# Patient Record
Sex: Male | Born: 1965 | Race: Black or African American | Hispanic: No | Marital: Single | Smoking: Current every day smoker
Health system: Southern US, Community
[De-identification: ages and names within clinical notes are randomized; demographics above are authoritative.]

## PROBLEM LIST (undated history)

## (undated) DIAGNOSIS — E119 Type 2 diabetes mellitus without complications: Secondary | ICD-10-CM

## (undated) DIAGNOSIS — I10 Essential (primary) hypertension: Secondary | ICD-10-CM

---

## 2002-10-26 ENCOUNTER — Encounter: Payer: Self-pay | Admitting: Family Medicine

## 2002-10-26 ENCOUNTER — Encounter: Admission: RE | Admit: 2002-10-26 | Discharge: 2002-10-26 | Payer: Self-pay | Admitting: Family Medicine

## 2007-03-25 ENCOUNTER — Emergency Department (HOSPITAL_COMMUNITY): Admission: EM | Admit: 2007-03-25 | Discharge: 2007-03-25 | Payer: Self-pay | Admitting: Emergency Medicine

## 2008-04-07 ENCOUNTER — Emergency Department (HOSPITAL_COMMUNITY): Admission: EM | Admit: 2008-04-07 | Discharge: 2008-04-07 | Payer: Self-pay | Admitting: Emergency Medicine

## 2008-04-12 IMAGING — CR DG CHEST 2V
2 series · 2 of 2 positions shown · non-contrast
Comparison: None

CLINICAL DATA: Chest pain.
 CHEST - 2 VIEW:

[w chest pa]
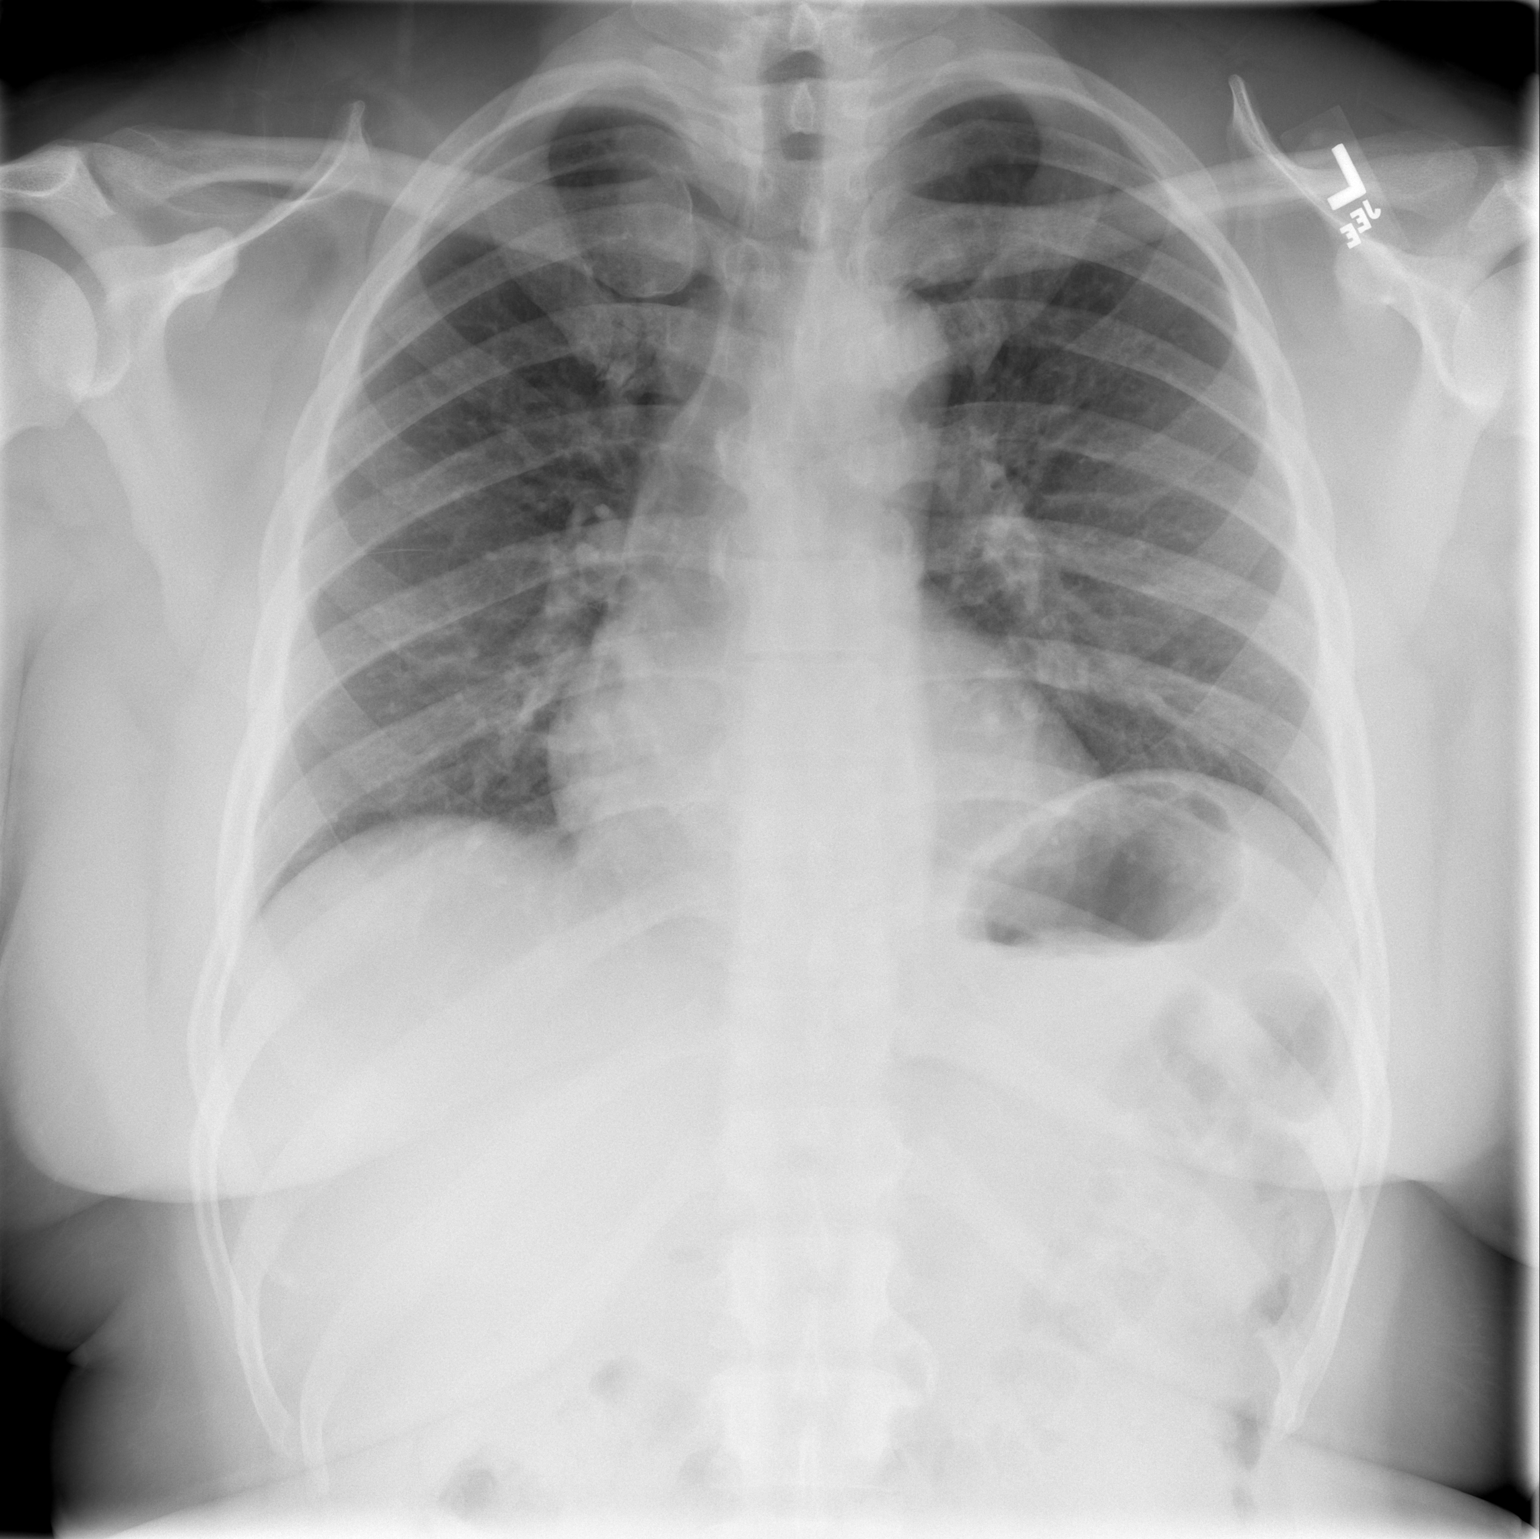

[w chest lat]
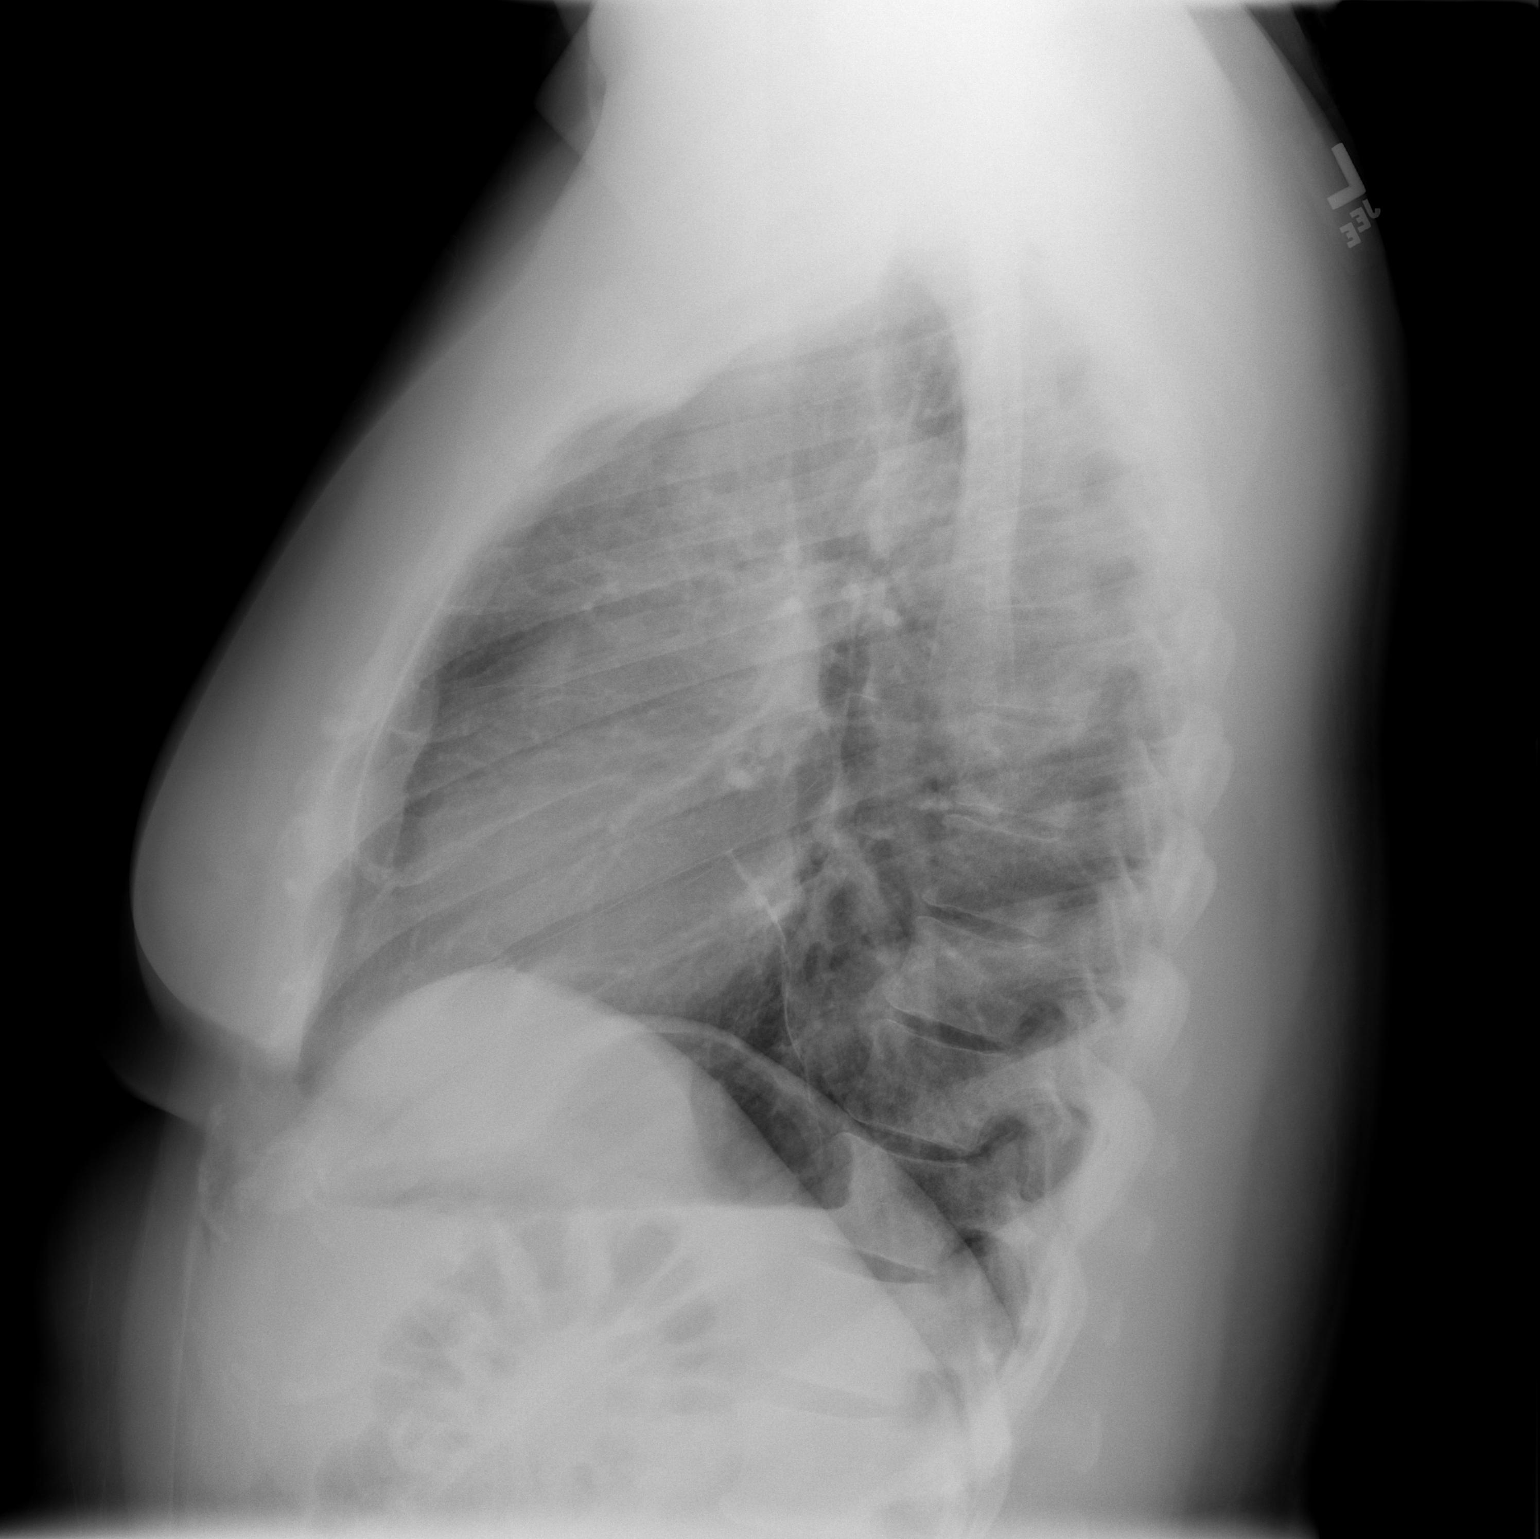

[2 of 2 positions shown; findings below may reference images not displayed]

FINDINGS: Heart size is normal.  There is no edema or infiltrate.  Small linear scar is noted in the base on the lateral view.  There is no effusion.
IMPRESSION: No acute abnormality.

## 2010-03-22 ENCOUNTER — Ambulatory Visit: Payer: Self-pay | Admitting: Internal Medicine

## 2010-04-27 ENCOUNTER — Encounter (INDEPENDENT_AMBULATORY_CARE_PROVIDER_SITE_OTHER): Payer: Self-pay | Admitting: Family Medicine

## 2010-04-27 LAB — CONVERTED CEMR LAB
ALT: 19 units/L (ref 0–53)
AST: 15 units/L (ref 0–37)
Albumin: 4.7 g/dL (ref 3.5–5.2)
Alkaline Phosphatase: 48 units/L (ref 39–117)
BUN: 13 mg/dL (ref 6–23)
CO2: 29 meq/L (ref 19–32)
Calcium: 9.7 mg/dL (ref 8.4–10.5)
Chloride: 106 meq/L (ref 96–112)
Cholesterol: 147 mg/dL (ref 0–200)
Creatinine, Ser: 1.12 mg/dL (ref 0.40–1.50)
Glucose, Bld: 95 mg/dL (ref 70–99)
HDL: 47 mg/dL (ref >39)
LDL Cholesterol: 64 mg/dL (ref 0–99)
Microalb, Ur: 0.59 mg/dL (ref 0.00–1.89)
Potassium: 3.9 meq/L (ref 3.5–5.3)
Sodium: 143 meq/L (ref 135–145)
Total Bilirubin: 0.5 mg/dL (ref 0.3–1.2)
Total CHOL/HDL Ratio: 3.1
Total Protein: 7.9 g/dL (ref 6.0–8.3)
Triglycerides: 181 mg/dL — ABNORMAL HIGH (ref <150)
VLDL: 36 mg/dL (ref 0–40)

## 2011-05-28 LAB — I-STAT 8, (EC8 V) (CONVERTED LAB)
Acid-Base Excess: 3 — ABNORMAL HIGH
BUN: 10
Bicarbonate: 27.3 — ABNORMAL HIGH
Chloride: 107
Glucose, Bld: 137 — ABNORMAL HIGH
HCT: 46
Hemoglobin: 15.6
Operator id: 277751
Potassium: 5.3 — ABNORMAL HIGH
Sodium: 138
TCO2: 28
pCO2, Ven: 38.7 — ABNORMAL LOW
pH, Ven: 7.456 — ABNORMAL HIGH

## 2011-05-28 LAB — POCT CARDIAC MARKERS
CKMB, poc: <1 — ABNORMAL LOW
Myoglobin, poc: 65
Operator id: 277751
Troponin i, poc: <0.05

## 2011-05-28 LAB — POCT I-STAT CREATININE
Creatinine, Ser: 1.2
Operator id: 277751

## 2011-05-28 LAB — CBC
HCT: 41.7
Hemoglobin: 14.3
MCHC: 34.2
MCV: 86.1
Platelets: 181
RBC: 4.84
RDW: 13.8
WBC: 8.4

## 2011-05-28 LAB — DIFFERENTIAL
Basophils Absolute: 0
Basophils Relative: 1
Eosinophils Absolute: 0.1
Eosinophils Relative: 1
Lymphocytes Relative: 27
Lymphs Abs: 2.3
Monocytes Absolute: 0.4
Monocytes Relative: 4
Neutro Abs: 5.7
Neutrophils Relative %: 67

## 2013-01-19 ENCOUNTER — Emergency Department (HOSPITAL_COMMUNITY)
Admission: EM | Admit: 2013-01-19 | Discharge: 2013-01-19 | Disposition: A | Discharge status: Home / Self Care | Payer: 59 | Source: Home / Self Care | Attending: Emergency Medicine | Admitting: Emergency Medicine

## 2013-01-19 ENCOUNTER — Encounter (HOSPITAL_COMMUNITY): Payer: Self-pay | Admitting: *Deleted

## 2013-01-19 DIAGNOSIS — M549 Dorsalgia, unspecified: Secondary | ICD-10-CM

## 2013-01-19 HISTORY — DX: Essential (primary) hypertension: I10

## 2013-01-19 HISTORY — DX: Type 2 diabetes mellitus without complications: E11.9

## 2013-01-19 LAB — POCT URINALYSIS DIP (DEVICE)
Bilirubin Urine: NEGATIVE
Glucose, UA: NEGATIVE mg/dL
Ketones, ur: NEGATIVE mg/dL
Leukocytes, UA: NEGATIVE

## 2013-01-19 MED ORDER — TRAMADOL HCL 50 MG PO TABS
50.0000 mg | ORAL_TABLET | Freq: Four times a day (QID) | ORAL | Status: AC | PRN
Start: 2013-01-19 — End: ?

## 2013-01-19 MED ORDER — CYCLOBENZAPRINE HCL 10 MG PO TABS: 10.0000 mg | ORAL_TABLET | Freq: Two times a day (BID) | ORAL | Status: AC | PRN

## 2013-01-19 NOTE — ED Notes (Signed)
Pt  Reports  Pain l  Side    With  Pain  Increased   With  Movement  And  Or  Deep  Breath  -    Pt  Reports  Symptoms  X  2  Days  -  Pt   Is  Sitting upright on  Exam table  Speaking in  Complete  sentances  And  Is  Awake  And  Alert      - pt  States  He  Has  A  History of Htn  /  Dm

## 2013-01-19 NOTE — ED Provider Notes (Signed)
History     CSN: [id number]  Arrival date & time 01/19/13  1702   First MD Initiated Contact with Patient 01/19/13 1801      Chief Complaint  Patient presents with  . Flank Pain    (Consider location/radiation/quality/duration/timing/severity/associated sxs/prior treatment) HPI Comments: Patient presents urgent care this evening describing the for the last 2 days he's been having pain on his left lower side sent back. He describes that he was mowing on Saturday when this first started. Patient denies any falls or injuries and denies any fevers, urinary symptoms, nausea vomiting or abdominal pain.  Patient is a 47 y.o. male presenting with flank pain. The history is provided by the patient.  Flank Pain This is a new problem. The current episode started 2 days ago. The problem occurs constantly. The problem has not changed since onset.Pertinent negatives include no chest pain, no abdominal pain, no headaches and no shortness of breath. The symptoms are aggravated by twisting. The symptoms are relieved by rest. He has tried nothing for the symptoms. The treatment provided no relief.    Past Medical History  Diagnosis Date  . Hypertension   . Diabetes mellitus without complication     History reviewed. No pertinent past surgical history.  History reviewed. No pertinent family history.  History  Substance Use Topics  . Smoking status: Never Smoker   . Smokeless tobacco: Not on file  . Alcohol Use: Yes      Review of Systems  Respiratory: Negative for shortness of breath.   Cardiovascular: Negative for chest pain.  Gastrointestinal: Negative for abdominal pain.  Genitourinary: Positive for flank pain.  Neurological: Negative for headaches.    Allergies  Review of patient's allergies indicates no known allergies.  Home Medications   Current Outpatient Rx  Name  Route  Sig  Dispense  Refill  . cyclobenzaprine (FLEXERIL) 10 MG tablet   Oral   Take 1 tablet (10 mg  total) by mouth 2 (two) times daily as needed for muscle spasms.   20 tablet   0   . traMADol (ULTRAM) 50 MG tablet   Oral   Take 1 tablet (50 mg total) by mouth every 6 (six) hours as needed for pain.   15 tablet   0     BP 128/78  Pulse 70  Temp(Src) 98 F (36.7 C) (Oral)  Resp 16  SpO2 100%  Physical Exam  ED Course  Procedures (including critical care time)  Labs Reviewed  POCT URINALYSIS DIP (DEVICE)   No results found.   1. Back pain, acute       MDM  Patient- looks comfortable afebrile, no hematuria. Exam most consistent with a muscular type pain, predominanatly lower lateral abdominal wall. Discussed with patient symptoms that should require of further evaluation. He agreed. Rx Flexeril Rx Tramadol        Jimmie Molly, MD 01/20/13 [phone]

## 2013-01-19 NOTE — Discharge Instructions (Signed)
Your current exam was most consistent with a muscular sprain/strain of your back. Take this medicines as prescribed. We discussed what symptoms should warrant further evaluation.

## 2013-01-26 ENCOUNTER — Encounter (HOSPITAL_COMMUNITY): Payer: Self-pay | Admitting: Emergency Medicine

## 2013-01-26 ENCOUNTER — Emergency Department (HOSPITAL_COMMUNITY)
Admission: EM | Admit: 2013-01-26 | Discharge: 2013-01-26 | Disposition: A | Discharge status: Home / Self Care | Payer: 59 | Source: Home / Self Care | Attending: Emergency Medicine | Admitting: Emergency Medicine

## 2013-01-26 DIAGNOSIS — T148XXA Other injury of unspecified body region, initial encounter: Secondary | ICD-10-CM

## 2013-01-26 DIAGNOSIS — Z23 Encounter for immunization: Secondary | ICD-10-CM

## 2013-01-26 DIAGNOSIS — IMO0002 Reserved for concepts with insufficient information to code with codable children: Secondary | ICD-10-CM

## 2013-01-26 MED ORDER — TETANUS-DIPHTH-ACELL PERTUSSIS 5-2.5-18.5 LF-MCG/0.5 IM SUSP
0.5000 mL | Freq: Once | INTRAMUSCULAR | Status: AC
  Administered 2013-01-26: 0.5 mL via INTRAMUSCULAR

## 2013-01-26 MED ORDER — TETANUS-DIPHTH-ACELL PERTUSSIS 5-2.5-18.5 LF-MCG/0.5 IM SUSP
INTRAMUSCULAR | Status: AC
  Filled 2013-01-26: qty 0.5

## 2013-01-26 NOTE — ED Notes (Signed)
Laceration to left hand, plam at base of thumb.  Bleeding controlled.  Reports this occurred today while trying to remove air conditioner from under a house.   Unsure of last tetanus

## 2013-01-26 NOTE — ED Provider Notes (Signed)
Chief Complaint:   Chief Complaint  Patient presents with  . Laceration    History of Present Illness:   Jimmy Spears is a 47 year old male who lacerated the web space of his left thumb and index finger around 11 today on an air conditioner. The bleeding is controlled. He has full range of motion of all the digits and denies any numbness or tingling. He cannot recall his last tetanus vaccine.  Review of Systems:  Other than noted above, the patient denies any of the following symptoms: Systemic:  No fever or chills. Musculoskeletal:  No joint pain or decreased range of motion. Neuro:  No numbness, tingling, or weakness.  PMFSH:  Past medical history, family history, social history, meds, and allergies were reviewed. He has diet-controlled diabetes.  Physical Exam:   Vital signs:  BP 133/94  Pulse 101  Temp(Src) 98.2 F (36.8 C) (Oral)  Resp 16  SpO2 100% Ext:  There is a 2 cm laceration in the webspace between the thumb and index finger. This is shallow and bleeding was controlled. He is able to move all his digits well. Sensation was intact. Flexor and extensor mechanisms were intact of the thumb and index finger.  All other joints had a full ROM without pain.  Pulses were full.  Good capillary refill in all digits.  No edema. Neurological:  Alert and oriented.  No muscle weakness.  Sensation was intact to light touch.   Procedure: Verbal informed consent was obtained.  The patient was informed of the risks and benefits of the procedure and understands and accepts.  Identity of the patient was verified verbally and by wristband.   The laceration area described above was prepped with Betadine and saline  and anesthetized with 10 mL of 2% Xylocaine without epinephrine.  The wound was then closed as follows:  Wound edges were approximated with 8 5-0 nylon sutures.  There were no immediate complications, and the patient tolerated the procedure well. The laceration was then cleansed,  Bacitracin ointment was applied and a clean, dry pressure dressing was put on.   Course in Urgent Care Center:   Given a Tdap vaccine.  Assessment:  The encounter diagnosis was Laceration.  Plan:   1.  The following meds were prescribed:   Discharge Medication List as of 01/26/2013  2:11 PM     2.  The patient was instructed in wound care and pain control, and handouts were given. 3.  The patient was told to return in 14 days for suture removal or wound recheck or sooner if any sign of infection.     Reuben Likes, MD 01/26/13 [phone]

## 2019-11-14 ENCOUNTER — Other Ambulatory Visit: Payer: Self-pay

## 2019-11-14 ENCOUNTER — Emergency Department (HOSPITAL_COMMUNITY): Payer: Self-pay

## 2019-11-14 ENCOUNTER — Encounter (HOSPITAL_COMMUNITY): Payer: Self-pay | Admitting: Emergency Medicine

## 2019-11-14 ENCOUNTER — Emergency Department (HOSPITAL_COMMUNITY)
Admission: EM | Admit: 2019-11-14 | Discharge: 2019-11-14 | Disposition: A | Discharge status: Home / Self Care | Payer: Self-pay | Attending: Emergency Medicine | Admitting: Emergency Medicine

## 2019-11-14 DIAGNOSIS — R002 Palpitations: Secondary | ICD-10-CM | POA: Insufficient documentation

## 2019-11-14 DIAGNOSIS — E119 Type 2 diabetes mellitus without complications: Secondary | ICD-10-CM | POA: Insufficient documentation

## 2019-11-14 DIAGNOSIS — I10 Essential (primary) hypertension: Secondary | ICD-10-CM | POA: Insufficient documentation

## 2019-11-14 DIAGNOSIS — F17290 Nicotine dependence, other tobacco product, uncomplicated: Secondary | ICD-10-CM | POA: Insufficient documentation

## 2019-11-14 LAB — BASIC METABOLIC PANEL
Anion gap: 14 (ref 5–15)
BUN: 12 mg/dL (ref 6–20)
CO2: 26 mmol/L (ref 22–32)
Calcium: 9.4 mg/dL (ref 8.9–10.3)
Chloride: 97 mmol/L — ABNORMAL LOW (ref 98–111)
Creatinine, Ser: 1.03 mg/dL (ref 0.61–1.24)
GFR calc Af Amer: >60 mL/min (ref >60)
GFR calc non Af Amer: >60 mL/min (ref >60)
Glucose, Bld: 382 mg/dL — ABNORMAL HIGH (ref 70–99)
Potassium: 4 mmol/L (ref 3.5–5.1)
Sodium: 137 mmol/L (ref 135–145)

## 2019-11-14 LAB — CBC
HCT: 50 % (ref 39.0–52.0)
Hemoglobin: 16 g/dL (ref 13.0–17.0)
MCH: 29.2 pg (ref 26.0–34.0)
MCHC: 32 g/dL (ref 30.0–36.0)
MCV: 91.2 fL (ref 80.0–100.0)
Platelets: 149 10*3/uL — ABNORMAL LOW (ref 150–400)
RBC: 5.48 MIL/uL (ref 4.22–5.81)
RDW: 13.3 % (ref 11.5–15.5)
WBC: 8.9 10*3/uL (ref 4.0–10.5)
nRBC: 0 % (ref 0.0–0.2)

## 2019-11-14 LAB — TROPONIN I (HIGH SENSITIVITY): Troponin I (High Sensitivity): 4 ng/L (ref <18)

## 2019-11-14 MED ORDER — SODIUM CHLORIDE 0.9% FLUSH: 3.0000 mL | Freq: Once | INTRAVENOUS | Status: DC

## 2019-11-14 NOTE — ED Notes (Signed)
Patient Alert and oriented to baseline. Stable and ambulatory to baseline. Patient verbalized understanding of the discharge instructions.  Patient belongings were taken by the patient.   

## 2019-11-14 NOTE — ED Triage Notes (Signed)
Pt to ED with c/o heart palpitations.  Pt st's onset while carrying a car battery.  Pt denies any hx of same.  St's he was drinking energy drinks yesterday.

## 2019-11-14 NOTE — ED Provider Notes (Signed)
[place] [facility] EMERGENCY DEPARTMENT Provider Note   CSN: [id number] Arrival date & time: 11/14/19  1617     History Chief Complaint  Patient presents with  . Irregular Heart Beat    Audley D Mcmillon is a 54 y.o. male.  HPI   54 year old male with palpitations.  Onset little bit before arrival.  He was carrying a let acid battery into [employer] when symptoms began.  He just had a heated discussion just before this as well.  He felt like his heart was racing/fluttering.  Lasted approximately 15 minutes that is static in the emergency room.  Has since resolved with no recurrence.  He denies having any pain.  No dyspnea.  Over the past several days he has been more or less in his usual state of health.  He did have two energy drinks yesterday but none today.  Past Medical History:  Diagnosis Date  . Diabetes mellitus without complication ([place])   . Hypertension     There are no problems to display for this patient.   History reviewed. No pertinent surgical history.     No family history on file.  Social History   Tobacco Use  . Smoking status: Current Every Day Smoker    Types: Cigars  . Smokeless tobacco: Never Used  Substance Use Topics  . Alcohol use: Yes  . Drug use: No    Home Medications Prior to Admission medications   Medication Sig Start Date End Date Taking? Authorizing Provider  cyclobenzaprine (FLEXERIL) 10 MG tablet Take 1 tablet (10 mg total) by mouth 2 (two) times daily as needed for muscle spasms. 01/19/13   Rosana Hoes, MD  traMADol (ULTRAM) 50 MG tablet Take 1 tablet (50 mg total) by mouth every 6 (six) hours as needed for pain. 01/19/13   Rosana Hoes, MD    Allergies    Patient has no known allergies.  Review of Systems   Review of Systems  All systems reviewed and negative, other than as noted in HPI.  Physical Exam Updated Vital Signs BP (!) 163/100   Pulse (!) 101   Temp 98.3 F (36.8 C) (Oral)   Resp (!) 22   Ht 5\' 11"   (1.803 m)   Wt 136.1 kg   SpO2 99%   BMI 41.84 kg/m   Physical Exam Vitals and nursing note reviewed.  Constitutional:      General: He is not in acute distress.    Appearance: He is well-developed.  HENT:     Head: Normocephalic and atraumatic.  Eyes:     General:        Right eye: No discharge.        Left eye: No discharge.     Conjunctiva/sclera: Conjunctivae normal.  Cardiovascular:     Rate and Rhythm: Normal rate and regular rhythm.     Heart sounds: Normal heart sounds. No murmur. No friction rub. No gallop.   Pulmonary:     Effort: Pulmonary effort is normal. No respiratory distress.     Breath sounds: Normal breath sounds.  Abdominal:     General: There is no distension.     Palpations: Abdomen is soft.     Tenderness: There is no abdominal tenderness.  Musculoskeletal:        General: No tenderness.     Cervical back: Neck supple.  Skin:    General: Skin is warm and dry.  Neurological:     Mental Status: He is alert.  Psychiatric:  Behavior: Behavior normal.        Thought Content: Thought content normal.     ED Results / Procedures / Treatments   Labs (all labs ordered are listed, but only abnormal results are displayed) Labs Reviewed  BASIC METABOLIC PANEL - Abnormal; Notable for the following components:      Result Value   Chloride 97 (*)    Glucose, Bld 382 (*)    All other components within normal limits  CBC - Abnormal; Notable for the following components:   Platelets 149 (*)    All other components within normal limits  TROPONIN I (HIGH SENSITIVITY)    EKG EKG Interpretation  Date/Time:  Saturday November 14 2019 16:21:18 EDT Ventricular Rate:  107 PR Interval:  162 QRS Duration: 92 QT Interval:  344 QTC Calculation: 459 R Axis:   -27 Text Interpretation: Sinus tachycardia Possible Anterior infarct , age undetermined Abnormal ECG Confirmed by Raeford Razor [phone]) on 11/14/2019 4:45:01 PM   Radiology DG Chest 2 View  Result  Date: 11/14/2019 CLINICAL DATA:  54 year old male with irregular heartbeat and palpitations EXAM: CHEST - 2 VIEW COMPARISON:  Prior chest x-ray 03/25/2007 FINDINGS: The lungs are clear and negative for focal airspace consolidation, pulmonary edema or suspicious pulmonary nodule. No pleural effusion or pneumothorax. Cardiac and mediastinal contours are within normal limits. No acute fracture or lytic or blastic osseous lesions. The visualized upper abdominal bowel gas pattern is unremarkable. IMPRESSION: Negative chest x-ray. Electronically Signed   By: Malachy Moan M.D.   On: 11/14/2019 17:07    Procedures Procedures (including critical care time)  Medications Ordered in ED Medications  sodium chloride flush (NS) 0.9 % injection 3 mL (has no administration in time range)    ED Course  I have reviewed the triage vital signs and the nursing notes.  Pertinent labs & imaging results that were available during my care of the patient were reviewed by me and considered in my medical decision making (see chart for details).    MDM Rules/Calculators/A&P                      54 year old male with palpitations which has since resolved without recurrence.  Borderline resting sinus tachycardia.  Appears comfortable though.  ED work-up fairly reassuring.  Hypertension noted.  Patient advised to have this rechecked as an outpatient.  If symptoms persist then he may potentially benefit from a Holter monitor.  Needs to follow-up with his PCP to discuss further.  Emergent return precautions discussed.   Final Clinical Impression(s) / ED Diagnoses Final diagnoses:  Palpitations    Rx / DC Orders ED Discharge Orders    None       Raeford Razor, MD 11/16/19 [phone]

## 2020-12-02 IMAGING — CR DG CHEST 2V
2 series · 2 of 2 positions shown · non-contrast
Comparison: Prior chest x-ray 03/25/2007

CLINICAL DATA: 53-year-old male with irregular heartbeat and
palpitations

EXAM:
CHEST - 2 VIEW

[chest pa]
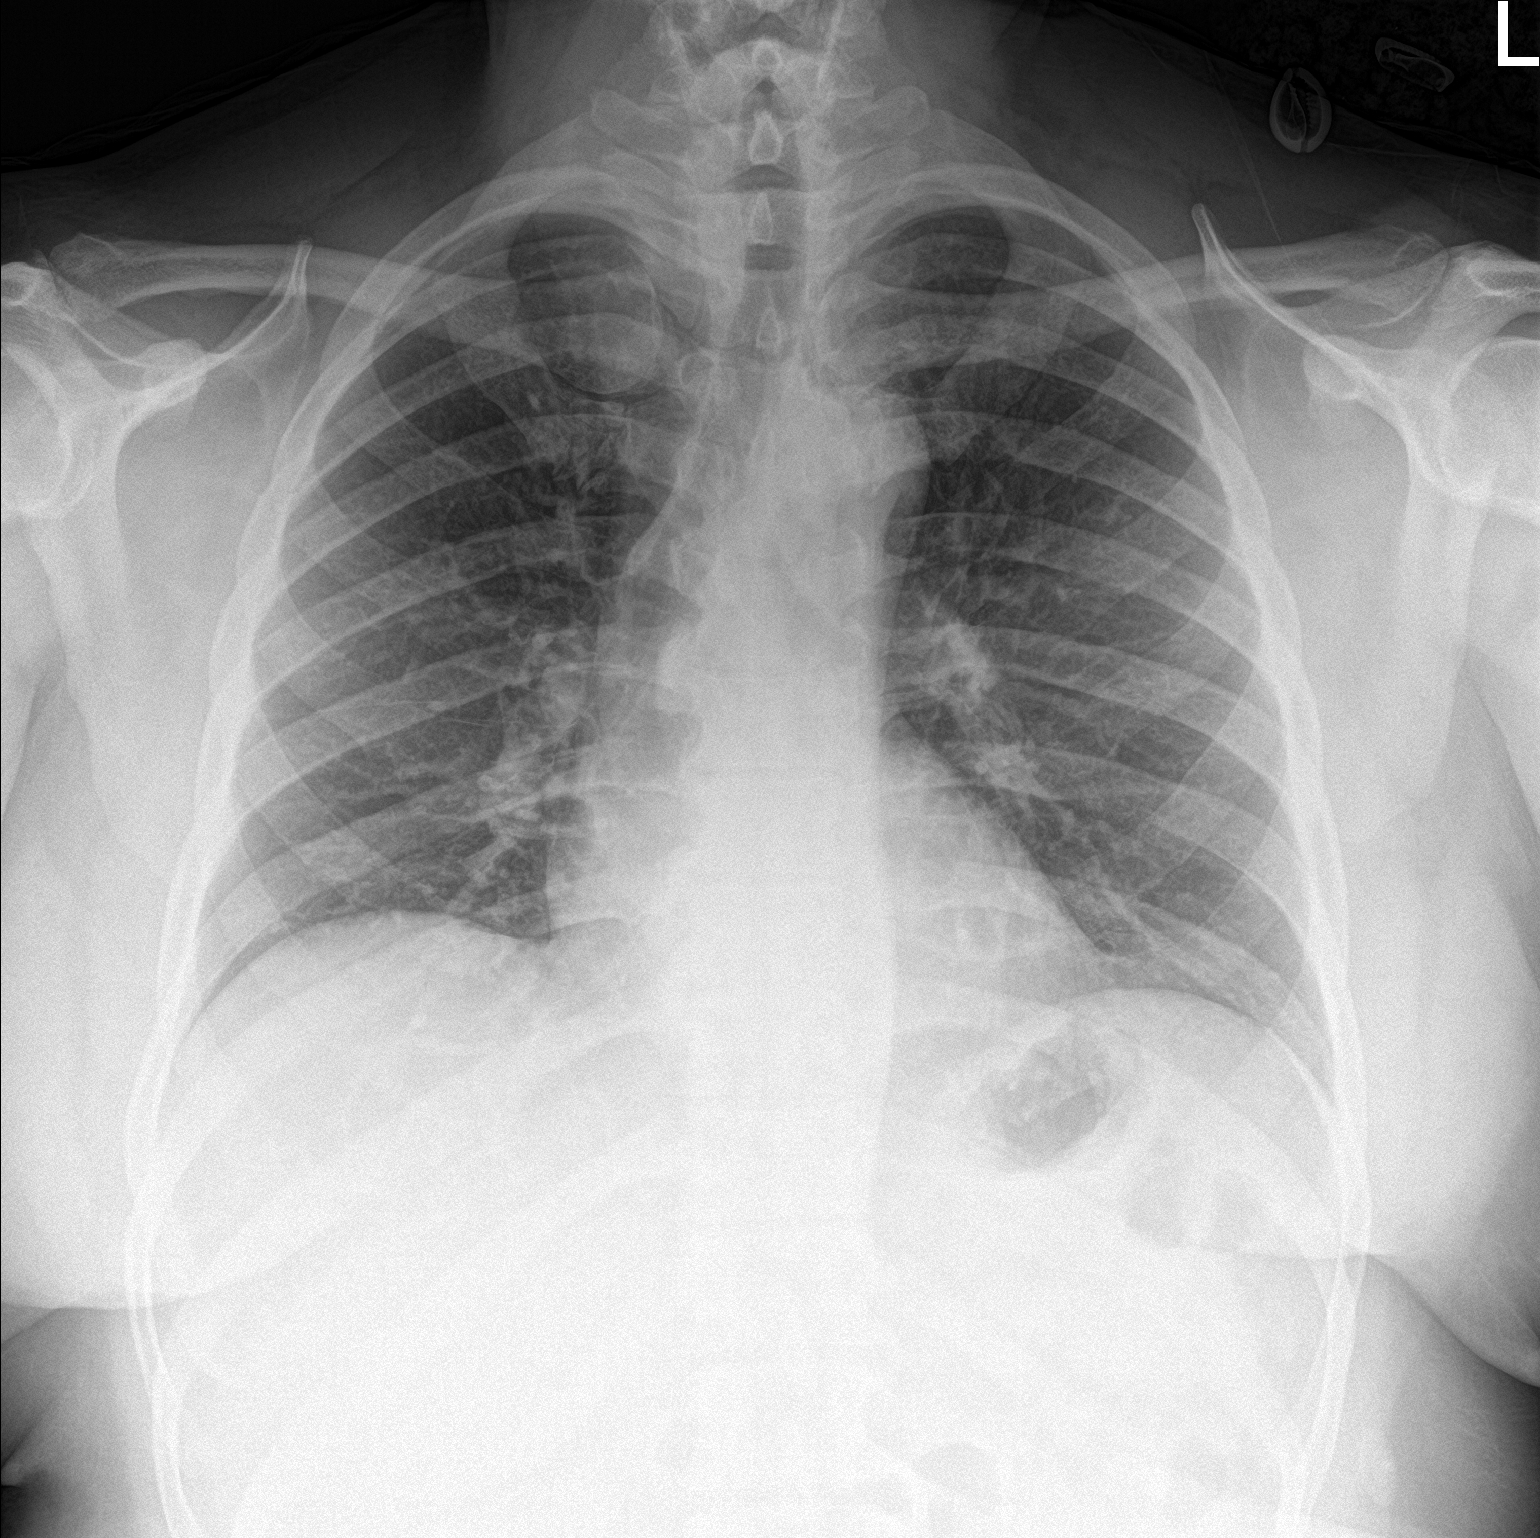

[chest lat]
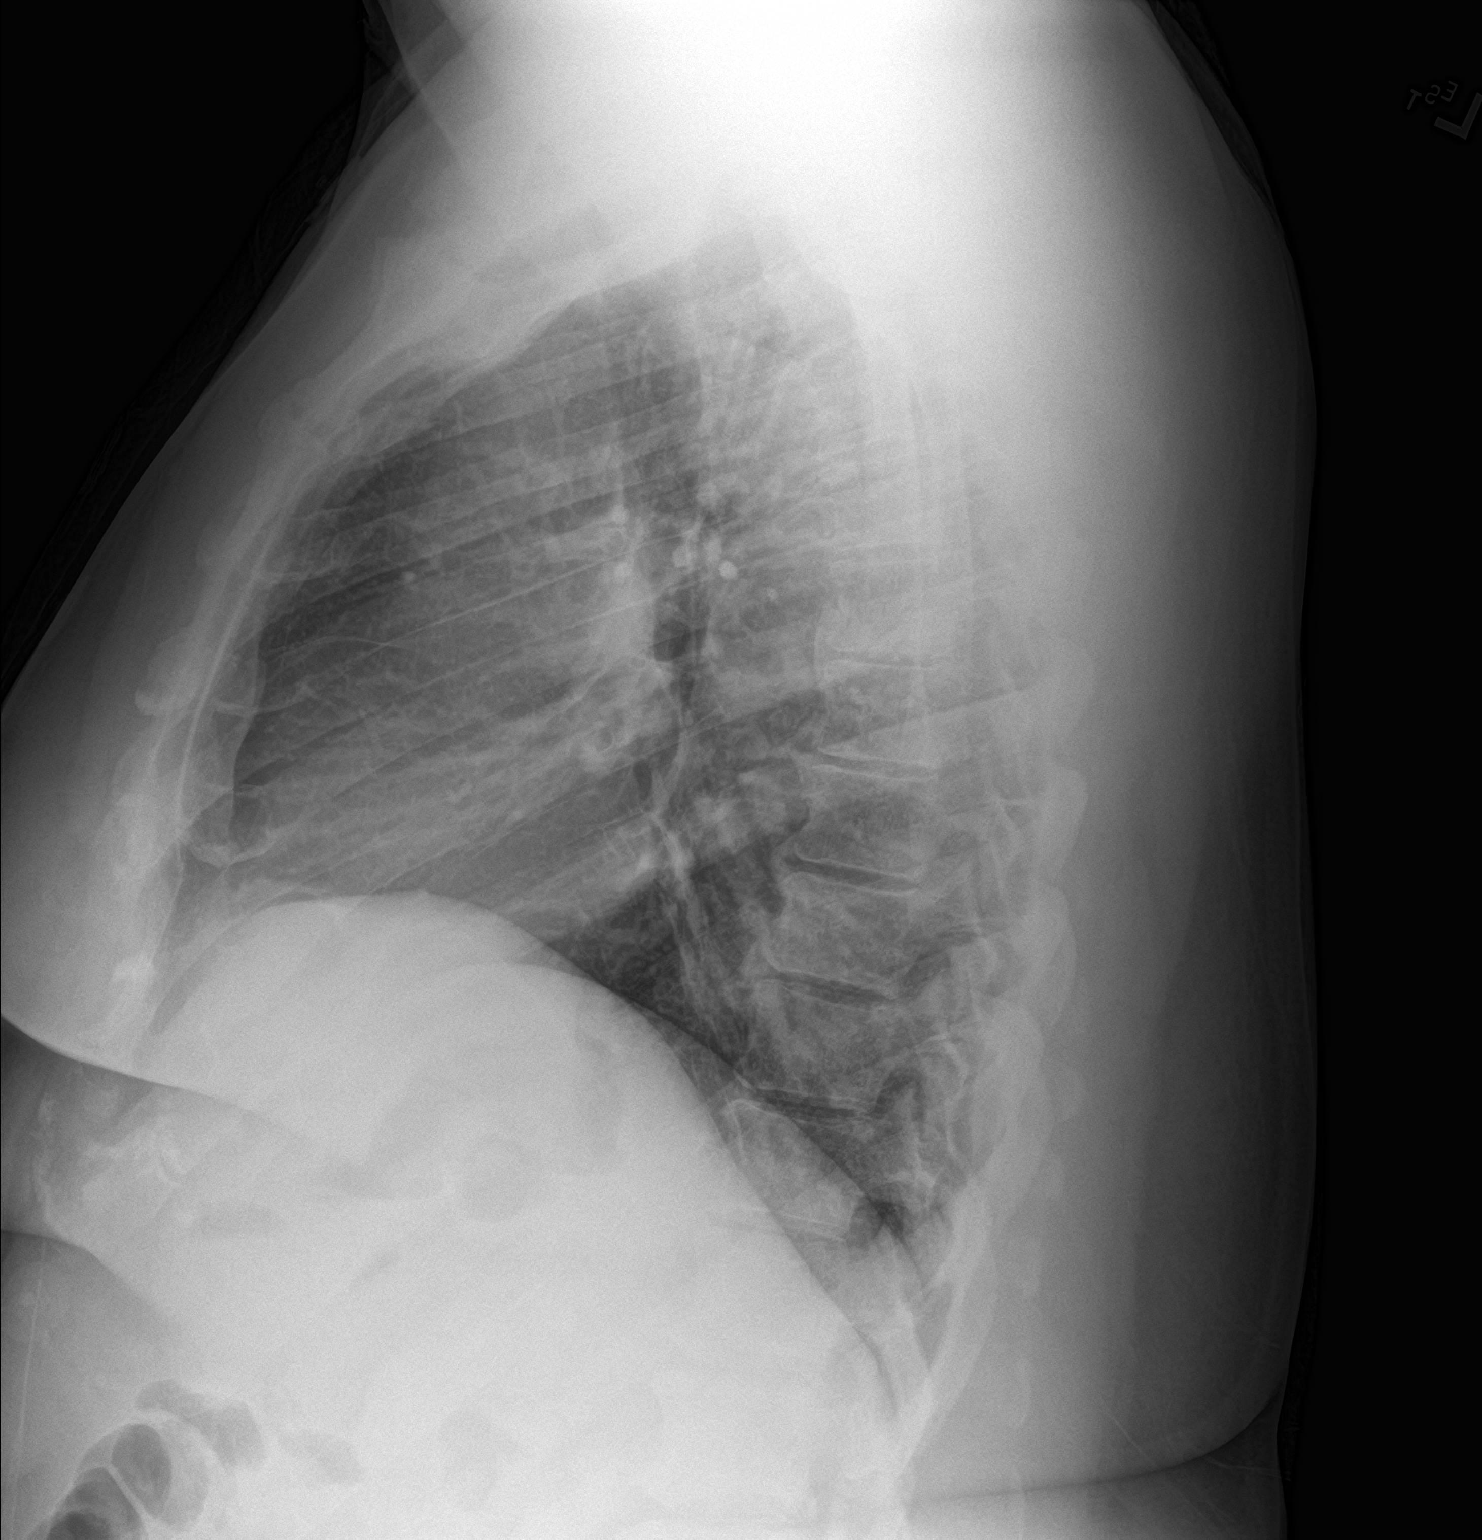

[2 of 2 positions shown; findings below may reference images not displayed]

FINDINGS: The lungs are clear and negative for focal airspace consolidation,
pulmonary edema or suspicious pulmonary nodule. No pleural effusion
or pneumothorax. Cardiac and mediastinal contours are within normal
limits. No acute fracture or lytic or blastic osseous lesions. The
visualized upper abdominal bowel gas pattern is unremarkable.
IMPRESSION: Negative chest x-ray.
